# Patient Record
Sex: Female | Born: 1970 | Race: Black or African American | Hispanic: No | Marital: Single | Smoking: Never smoker
Health system: Southern US, Community
[De-identification: ages and names within clinical notes are randomized; demographics above are authoritative.]

## PROBLEM LIST (undated history)

## (undated) DIAGNOSIS — I10 Essential (primary) hypertension: Secondary | ICD-10-CM

---

## 2007-08-05 ENCOUNTER — Other Ambulatory Visit: Admission: RE | Admit: 2007-08-05 | Discharge: 2007-08-05 | Payer: Self-pay | Admitting: Obstetrics & Gynecology

## 2008-08-15 ENCOUNTER — Other Ambulatory Visit: Admission: RE | Admit: 2008-08-15 | Discharge: 2008-08-15 | Payer: Self-pay | Admitting: Obstetrics & Gynecology

## 2014-01-24 ENCOUNTER — Emergency Department (HOSPITAL_BASED_OUTPATIENT_CLINIC_OR_DEPARTMENT_OTHER)
Admission: EM | Admit: 2014-01-24 | Discharge: 2014-01-24 | Disposition: A | Discharge status: Home / Self Care | Payer: Self-pay | Attending: Emergency Medicine | Admitting: Emergency Medicine

## 2014-01-24 ENCOUNTER — Emergency Department (HOSPITAL_BASED_OUTPATIENT_CLINIC_OR_DEPARTMENT_OTHER): Payer: Self-pay

## 2014-01-24 ENCOUNTER — Encounter (HOSPITAL_BASED_OUTPATIENT_CLINIC_OR_DEPARTMENT_OTHER): Payer: Self-pay | Admitting: Emergency Medicine

## 2014-01-24 DIAGNOSIS — Y9289 Other specified places as the place of occurrence of the external cause: Secondary | ICD-10-CM | POA: Insufficient documentation

## 2014-01-24 DIAGNOSIS — W010XXA Fall on same level from slipping, tripping and stumbling without subsequent striking against object, initial encounter: Secondary | ICD-10-CM | POA: Insufficient documentation

## 2014-01-24 DIAGNOSIS — Y9389 Activity, other specified: Secondary | ICD-10-CM | POA: Insufficient documentation

## 2014-01-24 DIAGNOSIS — IMO0002 Reserved for concepts with insufficient information to code with codable children: Secondary | ICD-10-CM | POA: Insufficient documentation

## 2014-01-24 DIAGNOSIS — I10 Essential (primary) hypertension: Secondary | ICD-10-CM | POA: Insufficient documentation

## 2014-01-24 DIAGNOSIS — M549 Dorsalgia, unspecified: Secondary | ICD-10-CM

## 2014-01-24 DIAGNOSIS — W19XXXA Unspecified fall, initial encounter: Secondary | ICD-10-CM

## 2014-01-24 HISTORY — DX: Essential (primary) hypertension: I10

## 2014-01-24 MED ORDER — CYCLOBENZAPRINE HCL 5 MG PO TABS: 5.0000 mg | ORAL_TABLET | Freq: Two times a day (BID) | ORAL | Status: DC | PRN

## 2014-01-24 MED ORDER — HYDROCODONE-ACETAMINOPHEN 5-325 MG PO TABS: 1.0000 | ORAL_TABLET | ORAL | Status: DC | PRN

## 2014-01-24 NOTE — ED Provider Notes (Signed)
CSN: [id number]     Arrival date & time 01/24/14  1338 History   First MD Initiated Contact with Patient 01/24/14 1417     Chief Complaint  Patient presents with  . Fall     (Consider location/radiation/quality/duration/timing/severity/associated sxs/prior Treatment) HPI Comments: Pt states that she slipped and fell on a wet floor 4 days ago and [facility] and had some x-rays done and no fractures were seen. Pt states that she has started to hurt localized to her lower back and her right buttock. Denies numbness or weakness. No previous injury.  The history is provided by the patient. No language interpreter was used.    Past Medical History  Diagnosis Date  . Hypertension    History reviewed. No pertinent past surgical history. No family history on file. History  Substance Use Topics  . Smoking status: Never Smoker   . Smokeless tobacco: Not on file  . Alcohol Use: No   OB History   Grav Para Term Preterm Abortions TAB SAB Ect Mult Living                 Review of Systems  Constitutional: Negative.   Respiratory: Negative.   Cardiovascular: Negative.       Allergies  Tramadol  Home Medications   Prior to Admission medications   Medication Sig Start Date End Date Taking? Authorizing Provider  LISINOPRIL PO Take by mouth.   Yes Historical Provider, MD   BP 151/90  Pulse 68  Temp(Src) 98.2 F (36.8 C) (Oral)  Resp 18  Ht 5' 5.5" (1.664 m)  Wt 173 lb (78.472 kg)  BMI 28.34 kg/m2  SpO2 100%  LMP 01/13/2014 Physical Exam  Nursing note and vitals reviewed. Constitutional: She is oriented to person, place, and time. She appears well-developed and well-nourished.  Cardiovascular: Normal rate and regular rhythm.   Pulmonary/Chest: Effort normal and breath sounds normal.  Abdominal: Soft. Bowel sounds are normal. There is no tenderness.  Musculoskeletal:       Lumbar back: She exhibits tenderness and bony tenderness. She exhibits no deformity.   Neurological: She is alert and oriented to person, place, and time. She exhibits normal muscle tone. Coordination normal.  Skin: Skin is warm and dry.  Psychiatric: She has a normal mood and affect.    ED Course  Procedures (including critical care time) Labs Review Labs Reviewed - No data to display  Imaging Review Dg Lumbar Spine Complete  01/24/2014   CLINICAL DATA:  Pain post trauma  EXAM: LUMBAR SPINE - COMPLETE 4+ VIEW  COMPARISON:  None.  FINDINGS: Frontal, lateral, spot lumbosacral lateral, and bilateral oblique views were obtained. There are 5 non-rib-bearing lumbar type vertebral bodies. There is no fracture or spondylolisthesis. Disk spaces appear intact. There is no appreciable facet arthropathy. There is an intrauterine device in the mid-pelvis toward the right.  IMPRESSION: Intrauterine device in mid pelvis. No fracture or appreciable arthropathy.   Electronically Signed   By: Bretta Bang M.D.   On: 01/24/2014 15:06     EKG Interpretation None      MDM   Final diagnoses:  Back pain  Fall    No acute back injury noted. Pt in neurovascularly intact. Will treat symptomatically with hydrocodone and flexeril    Teressa Lower, NP 01/24/14 1539

## 2014-01-24 NOTE — ED Notes (Signed)
4 days ago she slipped and fell. Injury to her lower back.

## 2014-01-24 NOTE — Discharge Instructions (Signed)
Lumbosacral Strain Lumbosacral strain is a strain of any of the parts that make up your lumbosacral vertebrae. Your lumbosacral vertebrae are the bones that make up the lower third of your backbone. Your lumbosacral vertebrae are held together by muscles and tough, fibrous tissue (ligaments).  CAUSES  A sudden blow to your back can cause lumbosacral strain. Also, anything that causes an excessive stretch of the muscles in the low back can cause this strain. This is typically seen when people exert themselves strenuously, fall, lift heavy objects, bend, or crouch repeatedly. RISK FACTORS  Physically demanding work.  Participation in pushing or pulling sports or sports that require sudden twist of the back (tennis, golf, baseball).  Weight lifting.  Excessive lower back curvature.  Forward-tilted pelvis.  Weak back or abdominal muscles or both.  Tight hamstrings. SIGNS AND SYMPTOMS  Lumbosacral strain may cause pain in the area of your injury or pain that moves (radiates) down your leg.  DIAGNOSIS Your health care provider can often diagnose lumbosacral strain through a physical exam. In some cases, you may need tests such as X-ray exams.  TREATMENT  Treatment for your lower back injury depends on many factors that your clinician will have to evaluate. However, most treatment will include the use of anti-inflammatory medicines. HOME CARE INSTRUCTIONS   Avoid hard physical activities (tennis, racquetball, waterskiing) if you are not in proper physical condition for it. This may aggravate or create problems.  If you have a back problem, avoid sports requiring sudden body movements. Swimming and walking are generally safer activities.  Maintain good posture.  Maintain a healthy weight.  For acute conditions, you may put ice on the injured area.  Put ice in a plastic bag.  Place a towel between your skin and the bag.  Leave the ice on for 20 minutes, 2 3 times a day.  When the  low back starts healing, stretching and strengthening exercises may be recommended. SEEK MEDICAL CARE IF:  Your back pain is getting worse.  You experience severe back pain not relieved with medicines. SEEK IMMEDIATE MEDICAL CARE IF:   You have numbness, tingling, weakness, or problems with the use of your arms or legs.  There is a change in bowel or bladder control.  You have increasing pain in any area of the body, including your belly (abdomen).  You notice shortness of breath, dizziness, or feel faint.  You feel sick to your stomach (nauseous), are throwing up (vomiting), or become sweaty.  You notice discoloration of your toes or legs, or your feet get very cold. MAKE SURE YOU:   Understand these instructions.  Will watch your condition.  Will get help right away if you are not doing well or get worse. Document Released: 05/14/2005 Document Revised: 05/25/2013 Document Reviewed: 03/23/2013 Exit[employer] Patient Information 2014 [employer].  

## 2014-02-06 NOTE — ED Provider Notes (Signed)
Medical screening examination/treatment/procedure(s) were performed by non-physician practitioner and as supervising physician I was immediately available for consultation/collaboration.   EKG Interpretation None        Rolan BuccoMelanie Mackayla Mullins, MD 02/06/14 [phone]

## 2015-02-24 ENCOUNTER — Emergency Department (HOSPITAL_BASED_OUTPATIENT_CLINIC_OR_DEPARTMENT_OTHER)
Admission: EM | Admit: 2015-02-24 | Discharge: 2015-02-25 | Disposition: A | Discharge status: Home / Self Care | Payer: Self-pay | Attending: Emergency Medicine | Admitting: Emergency Medicine

## 2015-02-24 ENCOUNTER — Encounter (HOSPITAL_BASED_OUTPATIENT_CLINIC_OR_DEPARTMENT_OTHER): Payer: Self-pay | Admitting: Emergency Medicine

## 2015-02-24 ENCOUNTER — Emergency Department (HOSPITAL_BASED_OUTPATIENT_CLINIC_OR_DEPARTMENT_OTHER): Payer: Self-pay

## 2015-02-24 DIAGNOSIS — Y998 Other external cause status: Secondary | ICD-10-CM | POA: Insufficient documentation

## 2015-02-24 DIAGNOSIS — S63601A Unspecified sprain of right thumb, initial encounter: Secondary | ICD-10-CM | POA: Insufficient documentation

## 2015-02-24 DIAGNOSIS — Y9301 Activity, walking, marching and hiking: Secondary | ICD-10-CM | POA: Insufficient documentation

## 2015-02-24 DIAGNOSIS — Y9289 Other specified places as the place of occurrence of the external cause: Secondary | ICD-10-CM | POA: Insufficient documentation

## 2015-02-24 DIAGNOSIS — Z79899 Other long term (current) drug therapy: Secondary | ICD-10-CM | POA: Insufficient documentation

## 2015-02-24 DIAGNOSIS — W01198A Fall on same level from slipping, tripping and stumbling with subsequent striking against other object, initial encounter: Secondary | ICD-10-CM | POA: Insufficient documentation

## 2015-02-24 DIAGNOSIS — I10 Essential (primary) hypertension: Secondary | ICD-10-CM | POA: Insufficient documentation

## 2015-02-24 MED ORDER — IBUPROFEN 800 MG PO TABS
800.0000 mg | ORAL_TABLET | Freq: Once | ORAL | Status: AC
  Administered 2015-02-24: 800 mg via ORAL
  Filled 2015-02-24: qty 1

## 2015-02-24 NOTE — Discharge Instructions (Signed)
For pain control please take ibuprofen (also known as Motrin or Advil) 800mg  (this is normally 4 over the counter pills) 3 times a day  for 5 days. Take with food to minimize stomach irritation.  Take vicodin for breakthrough pain, do not drink alcohol, drive, care for children or do other critical tasks while taking vicodin.  Do not hesitate to return to the Emergency Department for any new, worsening or concerning symptoms.   If you do not have a primary care doctor you can establish one at the   [facility]: [address] [place] [id number] [phone]  After you establish care. Let them know you were seen in the emergency room. They must obtain records for further management.

## 2015-02-24 NOTE — ED Notes (Signed)
Pinpoints pain to R posterior thumb, ongoing for ~ 3 weeks, no improvement with sporadic care. No meds taken today. Rates 8/10. Minimal swelling present. Reports increased pain with use, decreased strength. EDPA into see pt.

## 2015-02-24 NOTE — ED Provider Notes (Signed)
CSN: [id number]     Arrival date & time 02/24/15  2202 History   First MD Initiated Contact with Patient 02/24/15 2247     Chief Complaint  Patient presents with  . Hand Pain     (Consider location/radiation/quality/duration/timing/severity/associated sxs/prior Treatment) HPI   Blood pressure 165/96, pulse 77, temperature 98.2 F (36.8 C), temperature source Oral, resp. rate 20, last menstrual period 02/21/2015, SpO2 100 %.  Selena Livingston is a 44 y.o. female complaining of severe persistent pain to right first metacarpal onset 3 weeks ago. Patient states she was walking on a slippery area and slipped, bracing her hand against the wall. Patient normally takes ibuprofen at home with some relief however the pain is very severe, she is right-hand dominant and unable to complete simple ADLs like brushing her hair and driving is a problem. Rates her pain at 8 out of 10 and exacerbated by movement and palpation. She denies any prior trauma or surgeries to the affected area.  Past Medical History  Diagnosis Date  . Hypertension    History reviewed. No pertinent past surgical history. History reviewed. No pertinent family history. History  Substance Use Topics  . Smoking status: Never Smoker   . Smokeless tobacco: Not on file  . Alcohol Use: No   OB History    No data available     Review of Systems  10 systems reviewed and found to be negative, except as noted in the HPI.   Allergies  Tramadol  Home Medications   Prior to Admission medications   Medication Sig Start Date End Date Taking? Authorizing Provider  cyclobenzaprine (FLEXERIL) 5 MG tablet Take 1 tablet (5 mg total) by mouth 2 (two) times daily as needed for muscle spasms. 01/24/14   Teressa LowerVrinda Pickering, NP  HYDROcodone-acetaminophen (NORCO/VICODIN) 5-325 MG per tablet Take 1-2 tablets by mouth every 6 hours as needed for pain. 02/25/15   Jarmaine Ehrler, PA-C  LISINOPRIL PO Take by mouth.    Historical Provider, MD   BP  165/96 mmHg  Pulse 77  Temp(Src) 98.2 F (36.8 C) (Oral)  Resp 20  SpO2 100%  LMP 02/21/2015 Physical Exam  Constitutional: She is oriented to person, place, and time. She appears well-developed and well-nourished. No distress.  HENT:  Head: Normocephalic and atraumatic.  Mouth/Throat: Oropharynx is clear and moist.  Eyes: Conjunctivae and EOM are normal. Pupils are equal, round, and reactive to light.  Cardiovascular: Normal rate, regular rhythm and intact distal pulses.   Pulmonary/Chest: Effort normal and breath sounds normal. No stridor. No respiratory distress. She has no wheezes. She has no rales. She exhibits no tenderness.  Abdominal: Soft. Bowel sounds are normal. She exhibits no distension and no mass. There is no tenderness. There is no rebound and no guarding.  Musculoskeletal: Normal range of motion. She exhibits tenderness.  No overlying skin changes or swelling, patient is tender to palpation along the right first metacarpal. She has reduced range of motion in thumb extension. She is distally neurovascularly intact.  Neurological: She is alert and oriented to person, place, and time.  Psychiatric: She has a normal mood and affect.  Nursing note and vitals reviewed.   ED Course  Procedures (including critical care time) Labs Review Labs Reviewed - No data to display  Imaging Review Dg Hand Complete Right  02/25/2015   CLINICAL DATA:  Acute onset of right hand pain, between the index finger and thumb. Initial encounter.  EXAM: RIGHT HAND - COMPLETE 3+ VIEW  COMPARISON:  None.  FINDINGS: There is no evidence of fracture or dislocation. The joint spaces are preserved. The carpal rows are intact, and demonstrate normal alignment. The soft tissues are unremarkable in appearance.  IMPRESSION: No evidence of fracture or dislocation.   Electronically Signed   By: Roanna Raider M.D.   On: 02/25/2015 00:00     EKG Interpretation None      MDM   Final diagnoses:  Thumb  sprain, right, initial encounter    Filed Vitals:   02/24/15 2215  BP: 165/96  Pulse: 77  Temp: 98.2 F (36.8 C)  TempSrc: Oral  Resp: 20  SpO2: 100%    Medications  ibuprofen (ADVIL,MOTRIN) tablet 800 mg (800 mg Oral Given 02/24/15 2316)    Selena Livingston is a pleasant 44 y.o. female presenting with  right first metacarpal pain after patient fell and braced her hand gets the wall 3 weeks ago. Patient is neurovascularly intact with tenderness to palpation produced range of motion. Trace negative. Patient will be placed in thumb spica and given hand surgery follow-up with Dr. Janee Morn.  Evaluation does not show pathology that would require ongoing emergent intervention or inpatient treatment. Pt is hemodynamically stable and mentating appropriately. Discussed findings and plan with patient/guardian, who agrees with care plan. All questions answered. Return precautions discussed and outpatient follow up given.   New Prescriptions   HYDROCODONE-ACETAMINOPHEN (NORCO/VICODIN) 5-325 MG PER TABLET    Take 1-2 tablets by mouth every 6 hours as needed for pain.         Joni Reining Damari Hiltz, PA-C 02/25/15 0008  Geoffery Lyons, MD 02/27/15 [phone]

## 2015-02-24 NOTE — ED Notes (Signed)
Pt in c/o R hand pain, localized between index finger and thumb. Denies any known injury other than catching herself from falling by using the hand to brace on the wall.

## 2015-02-25 MED ORDER — HYDROCODONE-ACETAMINOPHEN 5-325 MG PO TABS: ORAL_TABLET | ORAL | Status: DC

## 2018-08-18 HISTORY — PX: ESOPHAGOGASTRODUODENOSCOPY: SHX1529

## 2018-08-18 HISTORY — PX: COLONOSCOPY: SHX174

## 2019-10-20 ENCOUNTER — Ambulatory Visit: Payer: Self-pay | Attending: Family

## 2019-10-20 DIAGNOSIS — Z23 Encounter for immunization: Secondary | ICD-10-CM | POA: Insufficient documentation

## 2019-10-20 NOTE — Progress Notes (Signed)
   Covid-19 Vaccination Clinic  Name:  Selena Livingston    MRN: [id number] DOB: [birth year]  10/20/2019  Selena Livingston was observed post Covid-19 immunization for 15 minutes without incident. She was provided with Vaccine Information Sheet and instruction to access the V-Safe system.   Selena Livingston was instructed to call 911 with any severe reactions post vaccine: Marland Kitchen Difficulty breathing  . Swelling of face and throat  . A fast heartbeat  . A bad rash all over body  . Dizziness and weakness   Immunizations Administered    Name Date Dose VIS Date Route   Moderna COVID-19 Vaccine 10/20/2019  1:52 PM 0.5 mL 07/19/2019 Intramuscular   Manufacturer: Moderna   Lot: [id number]   NDC: 36681-594-70

## 2019-11-22 ENCOUNTER — Ambulatory Visit: Payer: Self-pay | Attending: Family

## 2019-11-22 DIAGNOSIS — Z23 Encounter for immunization: Secondary | ICD-10-CM

## 2019-11-22 NOTE — Progress Notes (Signed)
   Covid-19 Vaccination Clinic  Name:  Artelia Game    MRN: [id number] DOB: 01-05-1971  11/22/2019  Ms. Lemaire was observed post Covid-19 immunization for 15 minutes without incident. She was provided with Vaccine Information Sheet and instruction to access the V-Safe system.   Ms. Barretto was instructed to call 911 with any severe reactions post vaccine: Marland Kitchen Difficulty breathing  . Swelling of face and throat  . A fast heartbeat  . A bad rash all over body  . Dizziness and weakness   Immunizations Administered    Name Date Dose VIS Date Route   Moderna COVID-19 Vaccine 11/22/2019  1:05 PM 0.5 mL 07/19/2019 Intramuscular   Manufacturer: Moderna   Lot: [id number]   NDC: 01751-025-85

## 2020-03-23 ENCOUNTER — Encounter: Payer: Self-pay | Admitting: Gastroenterology

## 2020-05-24 ENCOUNTER — Encounter: Payer: Self-pay | Admitting: Gastroenterology

## 2020-05-24 ENCOUNTER — Ambulatory Visit: Payer: Managed Care, Other (non HMO) | Admitting: Gastroenterology

## 2020-05-24 VITALS — BP 134/86 | HR 69 | Ht 65.5 in | Wt 194.2 lb

## 2020-05-24 DIAGNOSIS — R1013 Epigastric pain: Secondary | ICD-10-CM

## 2020-05-24 DIAGNOSIS — K219 Gastro-esophageal reflux disease without esophagitis: Secondary | ICD-10-CM

## 2020-05-24 MED ORDER — PANTOPRAZOLE SODIUM 40 MG PO TBEC: 40.0000 mg | DELAYED_RELEASE_TABLET | Freq: Two times a day (BID) | ORAL | 0 refills | Status: DC

## 2020-05-24 NOTE — Patient Instructions (Addendum)
If you are age 50 or older, your body mass index should be between 23-30. Your Body mass index is 31.83 kg/m. If this is out of the aforementioned range listed, please consider follow up with your Primary Care Provider.  If you are age 53 or younger, your body mass index should be between 19-25. Your Body mass index is 31.83 kg/m. If this is out of the aformentioned range listed, please consider follow up with your Primary Care Provider.    You have been scheduled for an abdominal ultrasound at [facility] (1st floor [address] ) on 05/29/20 at 2pm. Please arrive 15 minutes prior to your appointment for registration. Make certain not to have anything to eat or drink 6 hours prior to your appointment. Should you need to reschedule your appointment, please contact radiology at [phone]. This test typically takes about 30 minutes to perform.  We have sent the following medications to your pharmacy for you to pick up at your convenience: Protonix 40 mg   You have been scheduled for an endoscopy. Please follow written instructions given to you at your visit today. If you use inhalers (even only as needed), please bring them with you on the day of your procedure.    Thank you,  Dr. Lynann Bologna

## 2020-05-24 NOTE — Progress Notes (Signed)
Chief Complaint:   Referring Provider:  Dr Real Cons    ASSESSMENT AND PLAN;   #1. Epi pain with atypical chest pain. Neg stress test. Nl CBC, CMP 07/2019  #2. GERD. EGD 04/2018 with dil: candida esophagitis (treated with diflucan)  Plan: -EGD -[place] abo complete -Protonix 40mg  po bid #60, 6 refiils -If neg, HIDA with EF -[employer] for relux   HPI:    Selena Livingston is a 49 y.o. female  With HTN, palpitations, atypical chest pain with neg cardiac stress test  C/O Epi pain x 1-2 yrs, worse after eating any food, especially fatty foods. Rare nausea without vomiting. Assoc [place]burn x 1 yr, had EGD with eso dil 04/2018, no further dysphagia.  Feels miserable.  This is despite being on Protonix 40 mg p.o. once a day.  No odynophagia.  No recent antibiotics.  No nonsteroidals.  Denies having any diarrhea or constipation.  Would occasionally have abdominal bloating.  No lower abdominal pain.  No fever chills or night sweats.  No melena or hematochezia.  No recent weight loss or loss of appetite.  Advised to get rpt EGD performed and any further work-up.  Had had atypical chest pains with a neg cardiac stress test recently.  Has been advised by cardiology to proceed with GI work-up.  No sodas, chocolates, chewing gums, artificial sweeteners and candy. No NSAIDs   Past GI procedures: -EGD 04/2018 at [facility] (cannot pull up the report as it was done in surgical center)-Per notes, Candida esophagitis, ? Eso Stricture s/p dil -Colonoscopy 04/2018: Grossly normal.  Fair preparation.  Repeat in 5 years (per notes) Past Medical History:  Diagnosis Date  . Hypertension     Past Surgical History:  Procedure Laterality Date  . COLONOSCOPY  2020   [facility]   . ESOPHAGOGASTRODUODENOSCOPY  2020   [facility] with dilatation     Family History  Problem Relation Age of Onset  . Heart attack Brother   . Colon cancer Neg Hx   . Esophageal cancer Neg Hx     Social  History   Tobacco Use  . Smoking status: Never Smoker  . Smokeless tobacco: Never Used  Vaping Use  . Vaping Use: Never used  Substance Use Topics  . Alcohol use: Yes    Comment: ocassionally  . Drug use: No    Current Outpatient Medications  Medication Sig Dispense Refill  . amLODipine (NORVASC) 10 MG tablet Take 10 mg by mouth daily.    2021 losartan (COZAAR) 100 MG tablet Take 100 mg by mouth daily.    . medroxyPROGESTERone (DEPO-PROVERA) 150 MG/ML injection Inject into the muscle every 3 (three) months.    . pantoprazole (PROTONIX) 40 MG tablet Take 40 mg by mouth daily.    . propranolol ER (INDERAL LA) 60 MG 24 hr capsule Take 60 mg by mouth daily.     No current facility-administered medications for this visit.    Allergies  Allergen Reactions  . Lisinopril Cough  . Tramadol     itching    Review of Systems:  Constitutional: Denies fever, chills, diaphoresis, appetite change and fatigue.  HEENT: Denies photophobia, eye pain, redness, hearing loss, ear pain, congestion, sore throat, rhinorrhea, sneezing, mouth sores, neck pain, neck stiffness and tinnitus.   Respiratory: Denies SOB, DOE, cough, chest tightness,  and wheezing.   Cardiovascular: Denies chest pain, palpitations and leg swelling.  Genitourinary: Denies dysuria, urgency, frequency, hematuria, flank pain and difficulty urinating.  Musculoskeletal: Denies myalgias, back pain, joint swelling, arthralgias and gait problem.  Skin: No rash.  Neurological: Denies dizziness, seizures, syncope, weakness, light-headedness, numbness and headaches.  Hematological: Denies adenopathy. Easy bruising, personal or family bleeding history  Psychiatric/Behavioral: No anxiety or depression     Physical Exam:    BP 134/86   Pulse 69   Ht 5' 5.5" (1.664 m)   Wt 194 lb 4 oz (88.1 kg)   BMI 31.83 kg/m  Wt Readings from Last 3 Encounters:  05/24/20 194 lb 4 oz (88.1 kg)  01/24/14 173 lb (78.5 kg)   Constitutional:   Well-developed, in no acute distress. Psychiatric: Normal mood and affect. Behavior is normal. HEENT: Pupils normal.  Conjunctivae are normal. No scleral icterus. Neck supple.  Cardiovascular: Normal rate, regular rhythm. No edema Pulmonary/chest: Effort normal and breath sounds normal. No wheezing, rales or rhonchi. Abdominal: Soft, nondistended. Nontender. Bowel sounds active throughout. There are no masses palpable. No hepatomegaly. Rectal:  defered Neurological: Alert and oriented to person place and time. Skin: Skin is warm and dry. No rashes noted. Extensive notes were reviewed.   Edman Circle, MD 05/24/2020, 10:24 AM  Cc: No ref. provider found

## 2020-05-28 ENCOUNTER — Ambulatory Visit (HOSPITAL_BASED_OUTPATIENT_CLINIC_OR_DEPARTMENT_OTHER): Payer: Managed Care, Other (non HMO)

## 2020-05-29 ENCOUNTER — Ambulatory Visit (HOSPITAL_BASED_OUTPATIENT_CLINIC_OR_DEPARTMENT_OTHER): Payer: Managed Care, Other (non HMO)

## 2020-05-31 ENCOUNTER — Encounter: Payer: Self-pay | Admitting: Gastroenterology

## 2020-06-01 ENCOUNTER — Other Ambulatory Visit: Payer: Self-pay

## 2020-06-01 ENCOUNTER — Ambulatory Visit (HOSPITAL_BASED_OUTPATIENT_CLINIC_OR_DEPARTMENT_OTHER)
Admission: RE | Admit: 2020-06-01 | Discharge: 2020-06-01 | Disposition: A | Discharge status: Home / Self Care | Payer: Managed Care, Other (non HMO) | Source: Ambulatory Visit | Attending: Gastroenterology | Admitting: Gastroenterology

## 2020-06-01 DIAGNOSIS — K219 Gastro-esophageal reflux disease without esophagitis: Secondary | ICD-10-CM | POA: Diagnosis present

## 2020-06-01 DIAGNOSIS — R1013 Epigastric pain: Secondary | ICD-10-CM | POA: Insufficient documentation

## 2020-06-12 ENCOUNTER — Encounter: Payer: Self-pay | Admitting: Gastroenterology

## 2020-06-12 ENCOUNTER — Other Ambulatory Visit: Payer: Self-pay

## 2020-06-12 ENCOUNTER — Ambulatory Visit (AMBULATORY_SURGERY_CENTER): Payer: Managed Care, Other (non HMO) | Admitting: Gastroenterology

## 2020-06-12 VITALS — BP 101/68 | HR 72 | Temp 98.0°F | Resp 22 | Ht 65.0 in | Wt 194.0 lb

## 2020-06-12 DIAGNOSIS — K253 Acute gastric ulcer without hemorrhage or perforation: Secondary | ICD-10-CM

## 2020-06-12 DIAGNOSIS — K3189 Other diseases of stomach and duodenum: Secondary | ICD-10-CM

## 2020-06-12 DIAGNOSIS — K2980 Duodenitis without bleeding: Secondary | ICD-10-CM | POA: Diagnosis not present

## 2020-06-12 DIAGNOSIS — K449 Diaphragmatic hernia without obstruction or gangrene: Secondary | ICD-10-CM

## 2020-06-12 DIAGNOSIS — R1013 Epigastric pain: Secondary | ICD-10-CM

## 2020-06-12 DIAGNOSIS — K219 Gastro-esophageal reflux disease without esophagitis: Secondary | ICD-10-CM

## 2020-06-12 MED ORDER — SODIUM CHLORIDE 0.9 % IV SOLN: 500.0000 mL | Freq: Once | INTRAVENOUS | Status: DC

## 2020-06-12 NOTE — Op Note (Signed)
[facility] Patient Name: Selena Livingston Procedure Date: 06/12/2020 9:29 AM MRN: [id number] Endoscopist: Lynann Bolognaajesh Melrose Kearse , MD Age: 49 Referring MD:  Date of Birth: 07/14/1971 Gender: Female Account #: [id number] Procedure:                Upper GI endoscopy Indications:              Epigastric abdominal pain. No dysphagia. Medicines:                Monitored Anesthesia Care Procedure:                Pre-Anesthesia Assessment:                           - Prior to the procedure, a History and Physical                            was performed, and patient medications and                            allergies were reviewed. The patient's tolerance of                            previous anesthesia was also reviewed. The risks                            and benefits of the procedure and the sedation                            options and risks were discussed with the patient.                            All questions were answered, and informed consent                            was obtained. Prior Anticoagulants: The patient has                            taken no previous anticoagulant or antiplatelet                            agents. ASA Grade Assessment: II - A patient with                            mild systemic disease. After reviewing the risks                            and benefits, the patient was deemed in                            satisfactory condition to undergo the procedure.                           After obtaining informed consent, the endoscope was  passed under direct vision. Throughout the                            procedure, the patient's blood pressure, pulse, and                            oxygen saturations were monitored continuously. The                            Endoscope was introduced through the mouth, and                            advanced to the second part of duodenum. The upper                            GI endoscopy  was accomplished without difficulty.                            The patient tolerated the procedure well. Scope In: Scope Out: Findings:                 The examined esophagus was normal with well-defined                            Z-line at 35 cm. Transient wide open Schatzki's                            ring was noted at gastroesophageal junction.                           A small hiatal hernia was present.                           One non-bleeding cratered gastric ulcer with no                            stigmata of bleeding was found in the gastric                            antrum. The lesion was 6 mm in largest dimension.                            Biopsies were taken with a cold forceps for                            histology.                           2 small 4-6 mm nodular lesions (benign appearing)                            were noted in D1/D2 junction. Biopsies were taken  with a cold forceps for histology.                           The exam was otherwise without abnormality. Complications:            No immediate complications. Estimated Blood Loss:     Estimated blood loss: none. Impression:               - Small hiatal hernia.                           - Non-bleeding gastric ulcer with no stigmata of                            bleeding. Biopsied.                           - Benign small nodular lesions in duodenum                            (biopsied) Recommendation:           - Patient has a contact number available for                            emergencies. The signs and symptoms of potential                            delayed complications were discussed with the                            patient. Return to normal activities tomorrow.                            Written discharge instructions were provided to the                            patient.                           - Resume previous diet.                           - Continue  Protonix 40 mg p.o. once a day.                           - Await pathology results.                           - Avoid aspirin, ibuprofen, naproxen, or other                            non-steroidal anti-inflammatory drugs.                           - The findings and recommendations were discussed  with the patient's family. Lynann Bologna, MD 06/12/2020 9:47:50 AM This report has been signed electronically.

## 2020-06-12 NOTE — Progress Notes (Signed)
Called to room to assist during endoscopic procedure.  Patient ID and intended procedure confirmed with present staff. Received instructions for my participation in the procedure from the performing physician.  

## 2020-06-12 NOTE — Progress Notes (Signed)
Report to PACU, RN, vss, BBS= Clear.  

## 2020-06-12 NOTE — Progress Notes (Signed)
VS taken by C.W. 

## 2020-06-12 NOTE — Patient Instructions (Signed)
Discharge instructions given. Handout on Hiatal Hernia. Avoid aspirin,ibuprofen,naproxen,or other non-steroidal anti-inflammatory drugs. Resume previous medications. YOU HAD AN ENDOSCOPIC PROCEDURE TODAY AT THE [facility]:   Refer to the procedure report that was given to you for any specific questions about what was found during the examination.  If the procedure report does not answer your questions, please call your gastroenterologist to clarify.  If you requested that your care partner not be given the details of your procedure findings, then the procedure report has been included in a sealed envelope for you to review at your convenience later.  YOU SHOULD EXPECT: Some feelings of bloating in the abdomen. Passage of more gas than usual.  Walking can help get rid of the air that was put into your GI tract during the procedure and reduce the bloating. If you had a lower endoscopy (such as a colonoscopy or flexible sigmoidoscopy) you may notice spotting of blood in your stool or on the toilet paper. If you underwent a bowel prep for your procedure, you may not have a normal bowel movement for a few days.  Please Note:  You might notice some irritation and congestion in your nose or some drainage.  This is from the oxygen used during your procedure.  There is no need for concern and it should clear up in a day or so.  SYMPTOMS TO REPORT IMMEDIATELY:  Following upper endoscopy (EGD)  Vomiting of blood or coffee ground material  New chest pain or pain under the shoulder blades  Painful or persistently difficult swallowing  New shortness of breath  Fever of 100F or higher  Black, tarry-looking stools  For urgent or emergent issues, a gastroenterologist can be reached at any hour by calling [phone]. Do not use MyChart messaging for urgent concerns.    DIET:  We do recommend a small meal at first, but then you may proceed to your regular diet.  Drink plenty of fluids but you  should avoid alcoholic beverages for 24 hours.  ACTIVITY:  You should plan to take it easy for the rest of today and you should NOT DRIVE or use heavy machinery until tomorrow (because of the sedation medicines used during the test).    FOLLOW UP: Our staff will call the number listed on your records 48-72 hours following your procedure to check on you and address any questions or concerns that you may have regarding the information given to you following your procedure. If we do not reach you, we will leave a message.  We will attempt to reach you two times.  During this call, we will ask if you have developed any symptoms of COVID 19. If you develop any symptoms (ie: fever, flu-like symptoms, shortness of breath, cough etc.) before then, please call [phone].  If you test positive for Covid 19 in the 2 weeks post procedure, please call and report this information to [place].    If any biopsies were taken you will be contacted by phone or by letter within the next 1-3 weeks.  Please call us at [phone] if you have not heard about the biopsies in 3 weeks.    SIGNATURES/CONFIDENTIALITY: You and/or your care partner have signed paperwork which will be entered into your electronic medical record.  These signatures attest to the fact that that the information above on your After Visit Summary has been reviewed and is understood.  Full responsibility of the confidentiality of this discharge information lies with you and/or your  care-partner.

## 2020-06-14 ENCOUNTER — Telehealth: Payer: Self-pay | Admitting: *Deleted

## 2020-06-14 NOTE — Telephone Encounter (Signed)
°  Follow up Call-  Call back number 06/12/2020  Post procedure Call Back phone  # [phone]  Permission to leave phone message Yes  Some recent data might be hidden     Patient questions:  Do you have a fever, pain , or abdominal swelling? No. Pain Score  0 *  Have you tolerated food without any problems? Yes.  -pt reports she is eating fine but is just eating small meals to try and avoid having acid reflux. RN encouraged pt to continue this and to avoid foods such as spicy foods, chocolate and caffeine that can exacerbate reflux. Pt verbalizes understanding.   Have you been able to return to your normal activities? Yes.    Do you have any questions about your discharge instructions: Diet   No. Medications  No. Follow up visit  No.  Do you have questions or concerns about your Care? No.  Actions: * If pain score is 4 or above: No action needed, pain <4.  1. Have you developed a fever since your procedure? no  2.   Have you had an respiratory symptoms (SOB or cough) since your procedure? no  3.   Have you tested positive for COVID 19 since your procedure no  4.   Have you had any family members/close contacts diagnosed with the COVID 19 since your procedure?  no   If yes to any of these questions please route to Laverna Peace, RN and Karlton Lemon, RN

## 2020-06-21 ENCOUNTER — Other Ambulatory Visit: Payer: Self-pay | Admitting: Gastroenterology

## 2020-06-21 NOTE — Telephone Encounter (Signed)
Medication refill for this patient for protonix 2 times a day came. Is patient doing this 2 times a day or once a day?

## 2020-06-24 ENCOUNTER — Encounter: Payer: Self-pay | Admitting: Gastroenterology

## 2020-07-17 ENCOUNTER — Other Ambulatory Visit (HOSPITAL_BASED_OUTPATIENT_CLINIC_OR_DEPARTMENT_OTHER): Payer: Self-pay | Admitting: Internal Medicine

## 2020-07-17 ENCOUNTER — Ambulatory Visit: Payer: Managed Care, Other (non HMO) | Attending: Internal Medicine

## 2020-07-17 DIAGNOSIS — Z23 Encounter for immunization: Secondary | ICD-10-CM

## 2020-07-17 MED FILL — MODERNA COVID-19 VACCINE 10: 100 | 1 days supply | Qty: 0 | Fill #0

## 2020-07-17 NOTE — Progress Notes (Signed)
   Covid-19 Vaccination Clinic  Name:  Amrita Radu    MRN: [id number] DOB: [birth year]  07/17/2020  Ms. Pala was observed post Covid-19 immunization for 15 minutes without incident. She was provided with Vaccine Information Sheet and instruction to access the V-Safe system.   Ms. Coss was instructed to call 911 with any severe reactions post vaccine: Marland Kitchen Difficulty breathing  . Swelling of face and throat  . A fast heartbeat  . A bad rash all over body  . Dizziness and weakness   Immunizations Administered    No immunizations on file.

## 2020-10-06 ENCOUNTER — Other Ambulatory Visit: Payer: Self-pay

## 2020-10-06 ENCOUNTER — Emergency Department (HOSPITAL_BASED_OUTPATIENT_CLINIC_OR_DEPARTMENT_OTHER)
Admission: EM | Admit: 2020-10-06 | Discharge: 2020-10-06 | Disposition: A | Discharge status: Home / Self Care | Payer: Managed Care, Other (non HMO) | Attending: Emergency Medicine | Admitting: Emergency Medicine

## 2020-10-06 ENCOUNTER — Encounter (HOSPITAL_BASED_OUTPATIENT_CLINIC_OR_DEPARTMENT_OTHER): Payer: Self-pay | Admitting: *Deleted

## 2020-10-06 DIAGNOSIS — M545 Low back pain, unspecified: Secondary | ICD-10-CM | POA: Insufficient documentation

## 2020-10-06 DIAGNOSIS — Z79899 Other long term (current) drug therapy: Secondary | ICD-10-CM | POA: Insufficient documentation

## 2020-10-06 DIAGNOSIS — I10 Essential (primary) hypertension: Secondary | ICD-10-CM | POA: Insufficient documentation

## 2020-10-06 MED ORDER — CYCLOBENZAPRINE HCL 10 MG PO TABS: 10.0000 mg | ORAL_TABLET | Freq: Two times a day (BID) | ORAL | 0 refills | Status: AC | PRN

## 2020-10-06 MED ORDER — DEXAMETHASONE SODIUM PHOSPHATE 10 MG/ML IJ SOLN
10.0000 mg | Freq: Once | INTRAMUSCULAR | Status: AC
  Administered 2020-10-06: 10 mg via INTRAMUSCULAR
  Filled 2020-10-06: qty 1

## 2020-10-06 NOTE — Discharge Instructions (Signed)
Use the muscle relaxer and Voltaren cream as needed for symptomatic relief.  Follow-up with your doctor if your symptoms are improving within the next week.  Return here as needed for any worsening symptoms.

## 2020-10-06 NOTE — ED Provider Notes (Signed)
[facility] EMERGENCY DEPARTMENT Provider Note   CSN: [id number] Arrival date & time: 10/06/20  1804     History Chief Complaint  Patient presents with  . Back Pain    Selena Livingston is a 50 y.o. female.  Patient is a 50 year old female with a history of hypertension who presents with back pain she states that she has had pain across her low back for about 2 weeks.  It started when she woke up 1 morning and felt like her muscles were tight.  It had been gradually improving but this morning she woke up with worsening pain like how it had originally started.  She describes a tightness across her lower back.  There is no radiation down her legs.  No numbness or weakness to her extremities.  No associate abdominal pain.  No nausea or vomiting.  No fevers.  No urinary symptoms.  No loss of bowel or bladder control.  No known fevers.  She says it is worse with movement.  It is better if she stands up and it seems to get better as she moves around through the day.  It is worse when she is lying down.  She denies any known injuries to her back.  She denies any history of similar problems with her back in the past.  She has been using some heat and ice to the area as well as some over-the-counter muscle creams with some improvement in symptoms although got worse today.        Past Medical History:  Diagnosis Date  . Hypertension     There are no problems to display for this patient.   Past Surgical History:  Procedure Laterality Date  . COLONOSCOPY  2020   [facility]   . ESOPHAGOGASTRODUODENOSCOPY  2020   [facility] with dilatation      OB History   No obstetric history on file.     Family History  Problem Relation Age of Onset  . Heart attack Brother   . Colon cancer Neg Hx   . Esophageal cancer Neg Hx   . Rectal cancer Neg Hx   . Stomach cancer Neg Hx     Social History   Tobacco Use  . Smoking status: Never Smoker  . Smokeless tobacco: Never Used   Vaping Use  . Vaping Use: Never used  Substance Use Topics  . Alcohol use: Yes    Comment: ocassionally  . Drug use: No    Home Medications Prior to Admission medications   Medication Sig Start Date End Date Taking? Authorizing Provider  cyclobenzaprine (FLEXERIL) 10 MG tablet Take 1 tablet (10 mg total) by mouth 2 (two) times daily as needed for muscle spasms. 10/06/20  Yes [name], MD  amLODipine (NORVASC) 10 MG tablet Take 10 mg by mouth daily. 05/06/20   [provider]  losartan (COZAAR) 100 MG tablet Take 100 mg by mouth daily. 04/23/20   [provider]  medroxyPROGESTERone (DEPO-PROVERA) 150 MG/ML injection Inject into the muscle every 3 (three) months. 12/10/16   [provider]  pantoprazole (PROTONIX) 40 MG tablet TAKE 1 TABLET(40 MG) BY MOUTH TWICE DAILY 06/29/20   Lynann Bologna, MD  propranolol ER (INDERAL LA) 60 MG 24 hr capsule Take 60 mg by mouth daily. 05/06/20   [provider]    Allergies    Lisinopril and Tramadol  Review of Systems   Review of Systems  Constitutional: Negative for fever.  Gastrointestinal: Negative for  nausea and vomiting.  Genitourinary: Negative for difficulty urinating.  Musculoskeletal: Positive for back pain. Negative for arthralgias, joint swelling and neck pain.  Skin: Negative for wound.  Neurological: Negative for weakness, numbness and headaches.    Physical Exam Updated Vital Signs BP 138/87 (BP Location: Left Arm)   Pulse 73   Temp 98.4 F (36.9 C) (Oral)   Resp 16   Ht 5' 5.5" (1.664 m)   Wt 87.5 kg   LMP 07/18/2020   SpO2 100%   BMI 31.63 kg/m   Physical Exam Constitutional:      Appearance: She is well-developed and well-nourished.  HENT:     Head: Normocephalic and atraumatic.  Cardiovascular:     Rate and Rhythm: Normal rate.  Pulmonary:     Effort: Pulmonary effort is normal.  Musculoskeletal:        General: Tenderness and edema present.     Cervical back: Normal  range of motion and neck supple.     Comments: Positive tenderness across the musculature of the lower back bilaterally.  There is no midline tenderness.  Its in the lower lumbar region bilaterally.  Negative straight leg raise bilaterally.  Patellar reflexes symmetric bilaterally.  She has normal sensation and motor function to the lower extremities bilaterally.  Skin:    General: Skin is warm and dry.  Neurological:     Mental Status: She is alert and oriented to person, place, and time.  Psychiatric:        Mood and Affect: Mood and affect normal.     ED Results / Procedures / Treatments   Labs (all labs ordered are listed, but only abnormal results are displayed) Labs Reviewed - No data to display  EKG None  Radiology No results found.  Procedures Procedures   Medications Ordered in ED Medications  dexamethasone (DECADRON) injection 10 mg (10 mg Intramuscular Given 10/06/20 2134)    ED Course  I have reviewed the triage vital signs and the nursing notes.  Pertinent labs & imaging results that were available during my care of the patient were reviewed by me and considered in my medical decision making (see chart for details).    MDM Rules/Calculators/A&P                          Patient presents with low back pain.  She has no neurologic deficits.  No suggestions of cauda equina.  No trauma or other concerns for bony injury which would warrant imaging studies.  I discussed options with her.  We will prescribe her a muscle relaxer and give her a shot of Decadron in the ED.  We also discussed using Voltaren cream to the area.  She was encouraged to follow-up with her primary care doctor if her symptoms are not improving.  Return precautions were given. Final Clinical Impression(s) / ED Diagnoses Final diagnoses:  Acute bilateral low back pain without sciatica    Rx / DC Orders ED Discharge Orders         Ordered    cyclobenzaprine (FLEXERIL) 10 MG tablet  2 times daily  PRN        10/06/20 2126           [name], MD 10/06/20 2140

## 2020-10-06 NOTE — ED Triage Notes (Signed)
Pt reports low back pain x 2 weeks. 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 States pain had gotten better but today is worse. States pain is only in her back

## 2021-08-22 ENCOUNTER — Other Ambulatory Visit (HOSPITAL_COMMUNITY): Payer: Self-pay

## 2022-05-31 IMAGING — US US ABDOMEN COMPLETE
1 series · 14 of 25 positions shown · non-contrast
Comparison: None.

CLINICAL DATA: Epigastric pain for 3 days.

EXAM:
ABDOMEN ULTRASOUND COMPLETE

[Series 1: us abdomen complete · 14 of 77 slices shown]
[im 1/77]
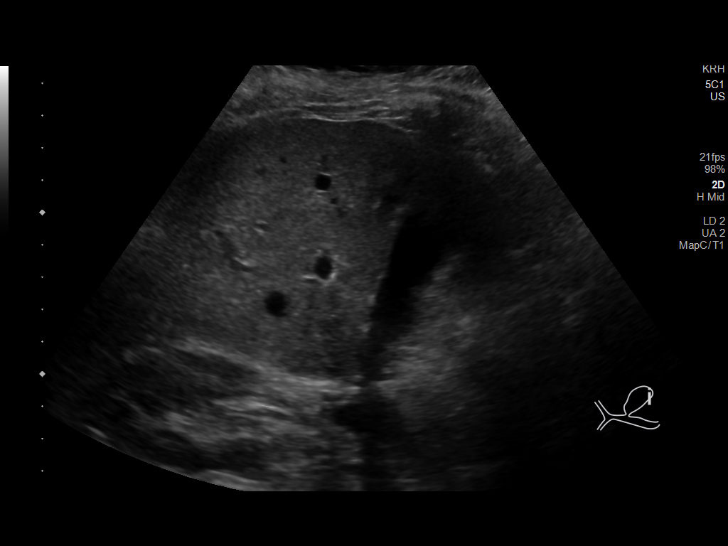
[im 7/77]
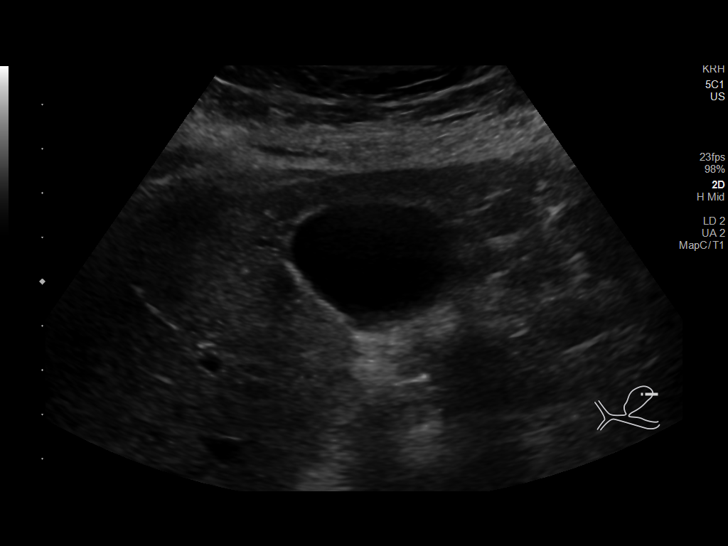
[im 13/77]
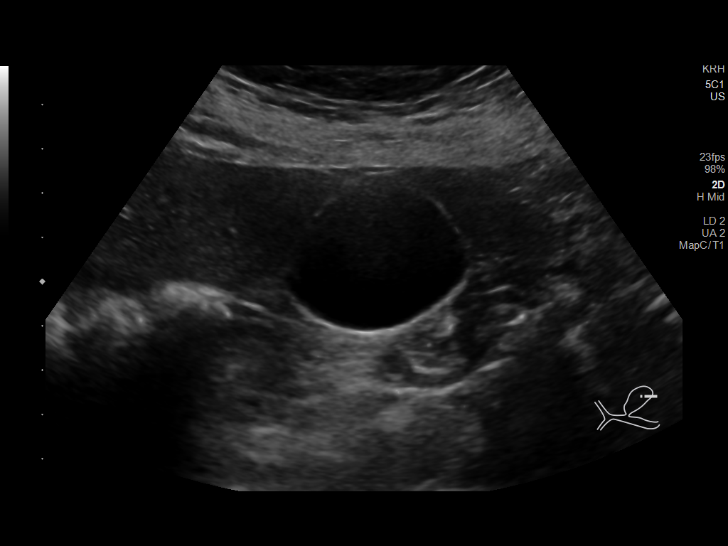
[im 20/77]
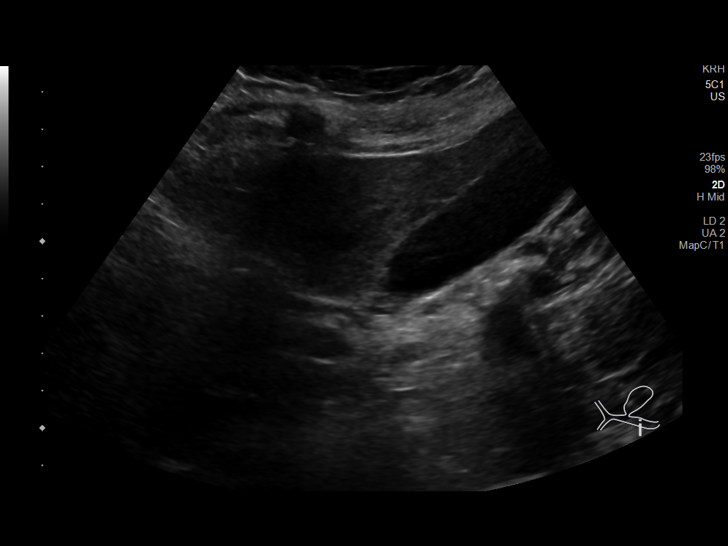
[im 26/77]
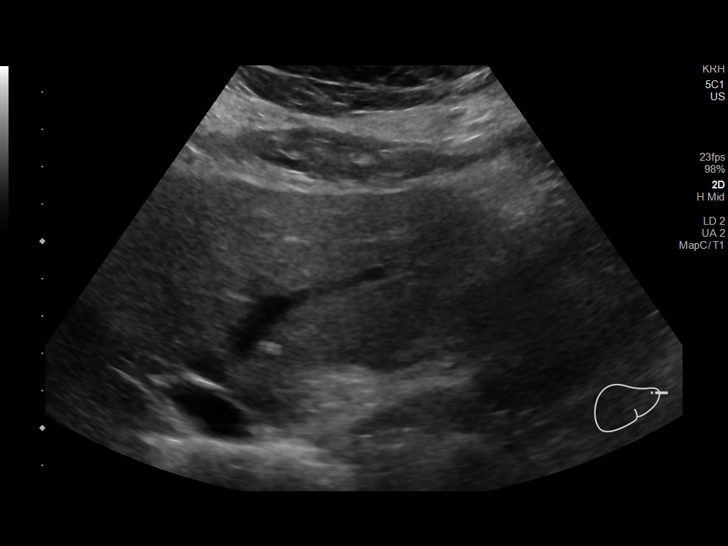
[im 29/77]
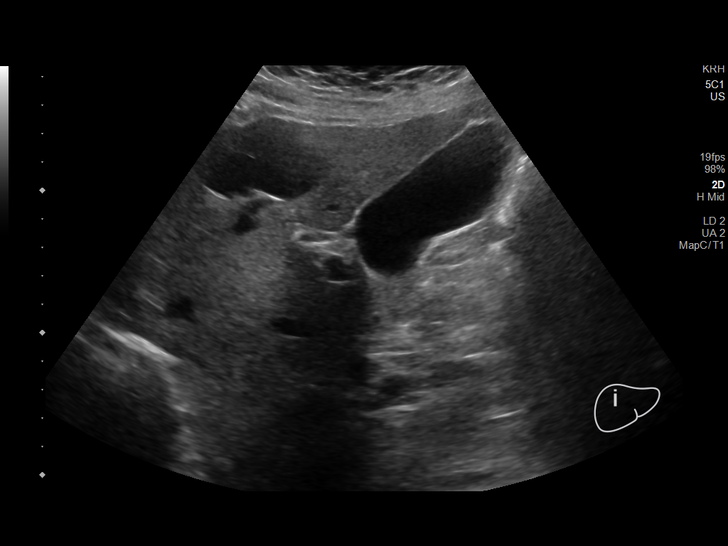
[im 35/77]
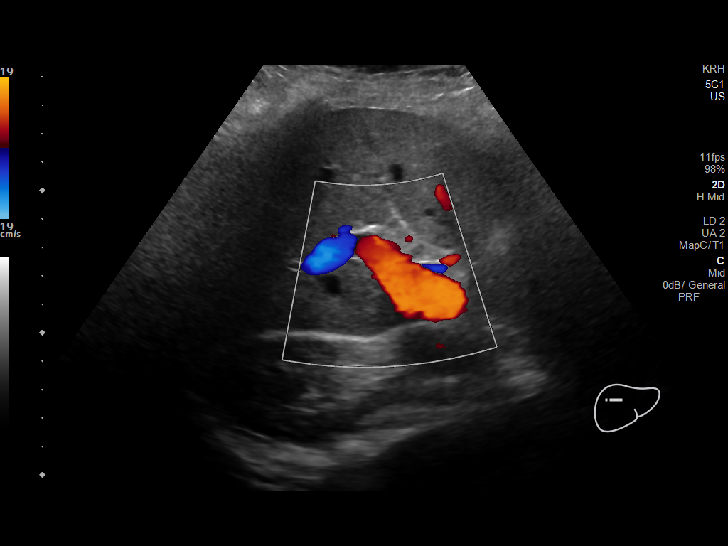
[im 42/77]
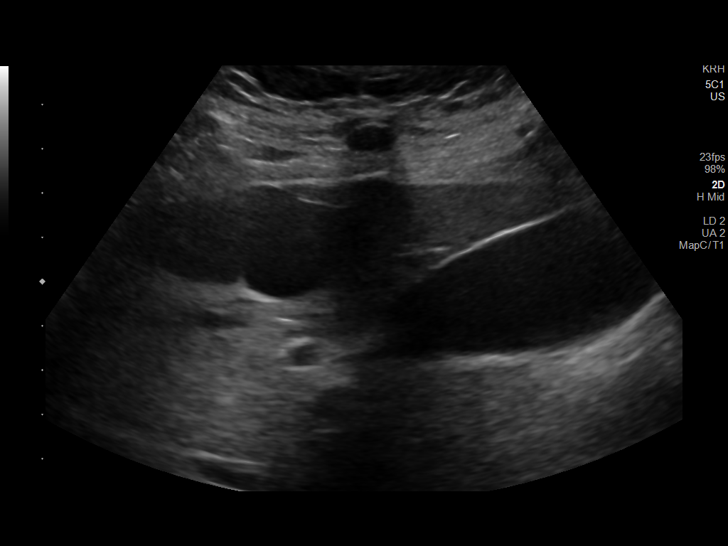
[im 48/77]
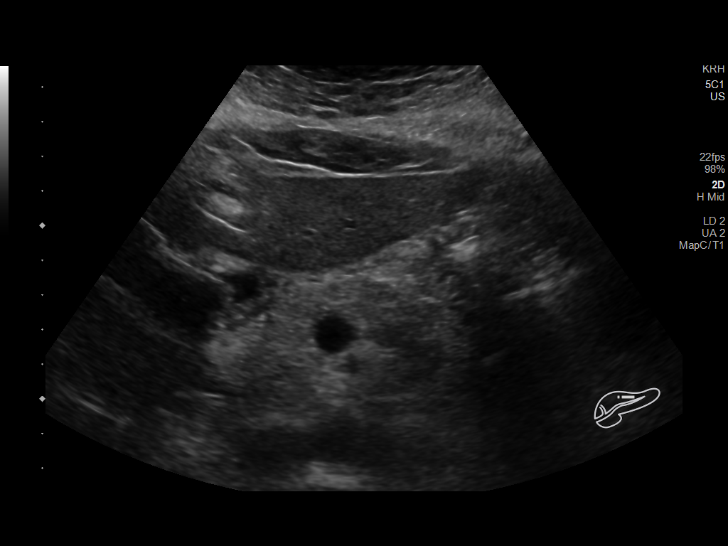
[im 51/77]
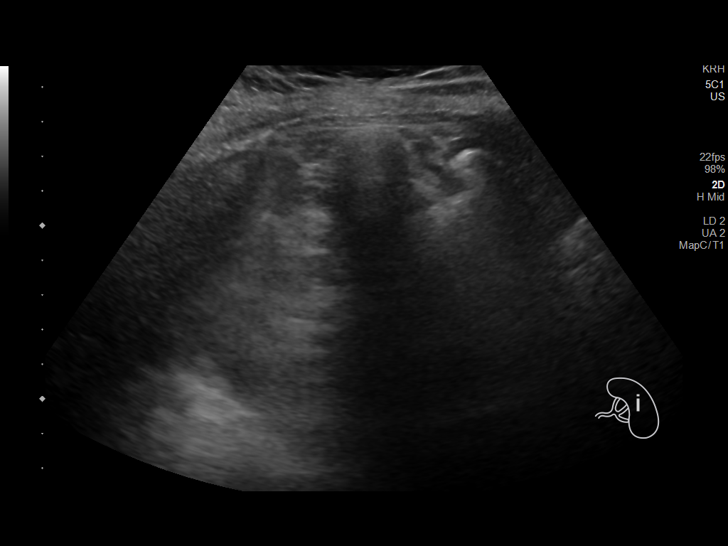
[im 58/77]
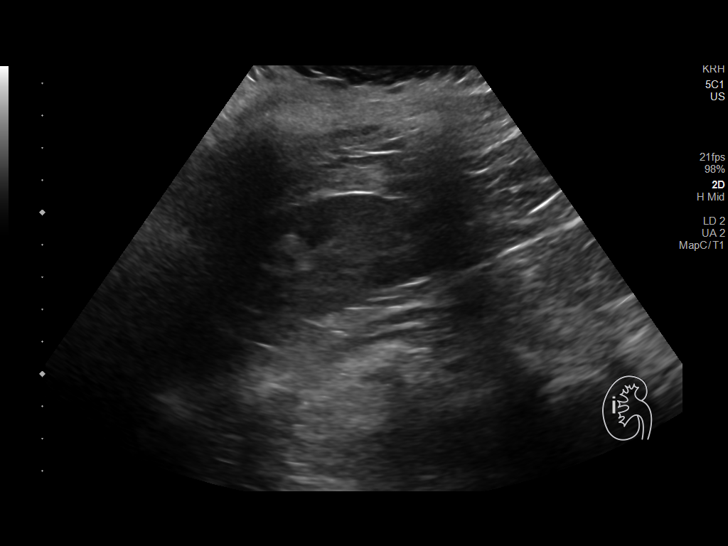
[im 64/77]
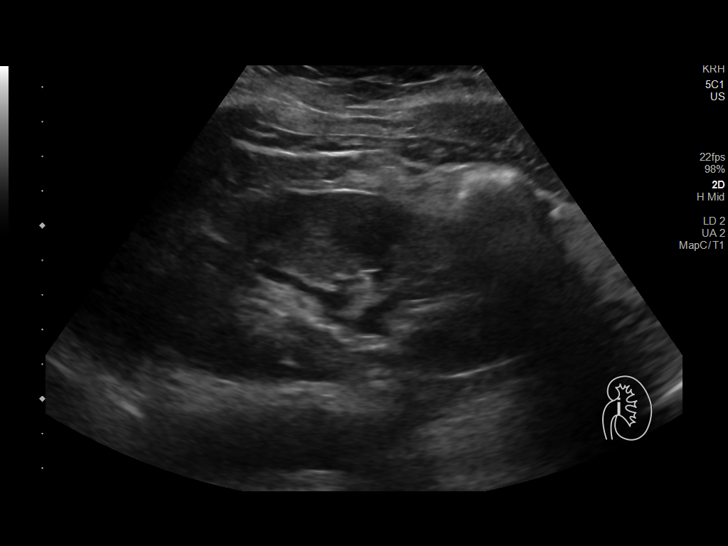
[im 70/77]
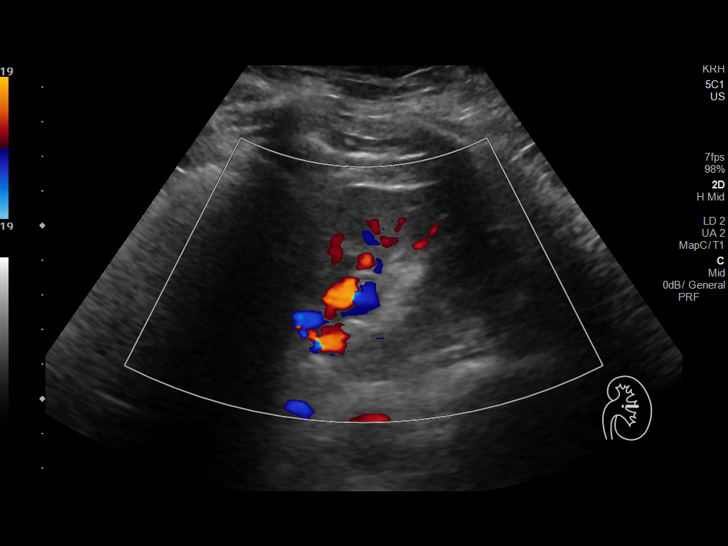
[im 77/77]
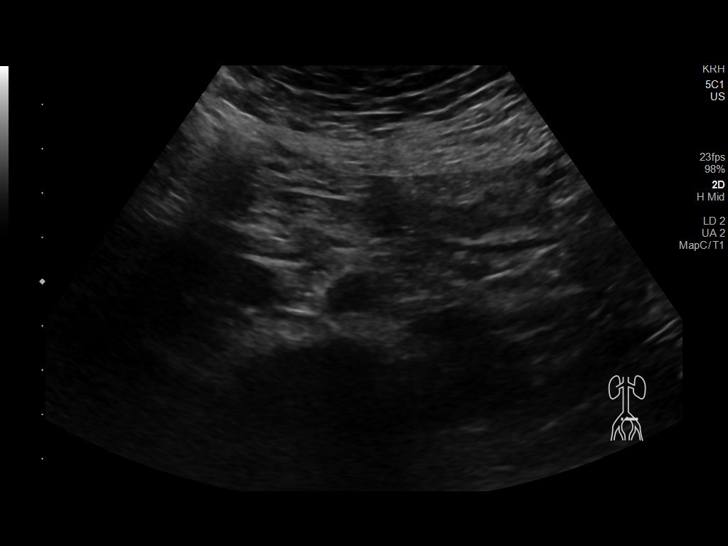

[14 of 25 positions shown; findings below may reference images not displayed]

FINDINGS: Gallbladder: No gallstones or wall thickening visualized. No
sonographic Murphy sign noted by sonographer.

Common bile duct: Diameter: 0.2 cm

Liver: A cyst measuring 4.0 x 4.2 x 2.6 cm is seen in the right
lobe. The cyst has a single thin septation. Echogenicity of the
liver is normal. Portal vein is patent on color Doppler imaging with
normal direction of blood flow towards the liver.

IVC: No abnormality visualized.

Pancreas: Visualized portion unremarkable.

Spleen: Size and appearance within normal limits.

Right Kidney: Length: 11.3 cm. Echogenicity within normal limits. No
mass or hydronephrosis visualized.

Left Kidney: Length: 11.0 cm. Echogenicity within normal limits. No
mass or hydronephrosis visualized.

Abdominal aorta: No aneurysm visualized.

Other findings: None.
IMPRESSION: No acute abnormality or finding to explain the patient's symptoms.
Negative for gallstones.

4.2 cm cyst in the right hepatic lobe is a single thin septation. No
follow-up imaging is indicated.

## 2023-08-27 ENCOUNTER — Ambulatory Visit: Payer: Managed Care, Other (non HMO) | Admitting: Nurse Practitioner
